# Patient Record
Sex: Female | Born: 1985 | Race: Black or African American | Hispanic: No | Marital: Single | Smoking: Never smoker
Health system: Southern US, Community
[De-identification: ages and names within clinical notes are randomized; demographics above are authoritative.]

## PROBLEM LIST (undated history)

## (undated) DIAGNOSIS — J45909 Unspecified asthma, uncomplicated: Secondary | ICD-10-CM

## (undated) DIAGNOSIS — G43909 Migraine, unspecified, not intractable, without status migrainosus: Secondary | ICD-10-CM

---

## 2014-09-29 ENCOUNTER — Emergency Department (HOSPITAL_COMMUNITY)
Admission: EM | Admit: 2014-09-29 | Discharge: 2014-09-29 | Disposition: A | Discharge status: Home / Self Care | Attending: Emergency Medicine | Admitting: Emergency Medicine

## 2014-09-29 DIAGNOSIS — M25511 Pain in right shoulder: Secondary | ICD-10-CM

## 2014-09-29 DIAGNOSIS — Y998 Other external cause status: Secondary | ICD-10-CM | POA: Diagnosis not present

## 2014-09-29 DIAGNOSIS — S4991XA Unspecified injury of right shoulder and upper arm, initial encounter: Secondary | ICD-10-CM | POA: Diagnosis not present

## 2014-09-29 DIAGNOSIS — W228XXA Striking against or struck by other objects, initial encounter: Secondary | ICD-10-CM | POA: Insufficient documentation

## 2014-09-29 DIAGNOSIS — Y9389 Activity, other specified: Secondary | ICD-10-CM | POA: Insufficient documentation

## 2014-09-29 DIAGNOSIS — Y9289 Other specified places as the place of occurrence of the external cause: Secondary | ICD-10-CM | POA: Insufficient documentation

## 2014-09-29 NOTE — ED Notes (Signed)
PT reports a injury at work this AM @ 0330 . Pt reports for eval of incressing pain to RT shoulder ,RT arm and RT hand. Pt reports a bruise to RT small finger.

## 2014-09-29 NOTE — ED Provider Notes (Signed)
CSN: [id number]     Arrival date & time 09/29/14  4098 History   First MD Initiated Contact with Patient 09/29/14 [phone]     Chief Complaint  Patient presents with  . Shoulder Pain  . Arm Injury  . Hand Pain   HPI   29 year old female presents today with complaints of right shoulder pain. Patient reports she was working today when he be some equipment from a vehicle hit her in the right anterior shoulder. Patient notes pain to the site immediately, she also notes that she was struck in the right third finger. Patient reports that she has full active range of motion, minimal pain with this. Patient denies any loss of distal sensation strength or function. No history of shoulder injuries. Patient is not tried any over-the-counter therapies at this point.  No past medical history on file. No past surgical history on file. No family history on file. Social History  Substance Use Topics  . Smoking status: Not on file  . Smokeless tobacco: Not on file  . Alcohol Use: Not on file   OB History    No data available     Review of Systems  All other systems reviewed and are negative.   Allergies  Review of patient's allergies indicates not on file.  Home Medications   Prior to Admission medications   Not on File   BP 111/65 mmHg  Pulse 96  Temp(Src) 97.5 F (36.4 C) (Oral)  Resp 18  SpO2 100%  LMP 08/29/2014   Physical Exam  Constitutional: She is oriented to person, place, and time. She appears well-developed and well-nourished.  HENT:  Head: Normocephalic and atraumatic.  Eyes: Conjunctivae are normal. Pupils are equal, round, and reactive to light. Right eye exhibits no discharge. Left eye exhibits no discharge. No scleral icterus.  Neck: Normal range of motion. No JVD present. No tracheal deviation present.  Pulmonary/Chest: Effort normal. No stridor.  Musculoskeletal:  Right shoulder: No obvious signs of trauma, symmetrical bilateral. Full active range of motion, minimal  pain with forward flexion and lateral flexion. Minimal tender with palpation of anterior shoulder. Sensation grossly intact, strength 5 out of 5, grip strength 5 out of 5, radial pulses 2+, Refill less than 3 seconds  Right hand a traumatic, full active range of motion, nontender to palpation  Neurological: She is alert and oriented to person, place, and time. Coordination normal.  Psychiatric: She has a normal mood and affect. Her behavior is normal. Judgment and thought content normal.  Nursing note and vitals reviewed.   ED Course  Procedures (including critical care time) Labs Review Labs Reviewed - No data to display  Imaging Review No results found. I have personally reviewed and evaluated these images and lab results as part of my medical decision-making.   EKG Interpretation None      MDM   Final diagnoses:  Right shoulder pain   Labs:   Imaging: None indicated   Consults:  Therapeutics:  Discharge Meds:   Assessment/Plan: Patient presents with right shoulder pain, atraumatic, full active range of motion.  Patient's child was in the room during the time my evaluation she repeatedly was able to lift the patient without any signs of pain or difficulty, no obvious strength or significant pain to the right shoulder. She will be discharged home with symptomatic care instructions, return precautions. Follow-up with primary care in 1 week for reevaluation. Patient verbalizes understanding and agreement to today's plan.  Eyvonne Mechanic, PA-C 09/29/14 1303  Derwood Kaplan, MD 09/29/14 [phone]

## 2014-09-29 NOTE — ED Notes (Signed)
Declined W/C at D/C and was escorted to lobby by RN. 

## 2014-09-29 NOTE — Discharge Instructions (Signed)
Arthralgia Arthralgia is joint pain. A joint is a place where two bones meet. Joint pain can happen for many reasons. The joint can be bruised, stiff, infected, or weak from aging. Pain usually goes away after resting and taking medicine for soreness.  HOME CARE  Rest the joint as told by your doctor.  Keep the sore joint raised (elevated) for the first 24 hours.  Put ice on the joint area.  Put ice in a plastic bag.  Place a towel between your skin and the bag.  Leave the ice on for 15-20 minutes, 03-04 times a day.  Wear your splint, casting, elastic bandage, or sling as told by your doctor.  Only take medicine as told by your doctor. Do not take aspirin.  Use crutches as told by your doctor. Do not put weight on the joint until told to by your doctor. GET HELP RIGHT AWAY IF:   You have bruising, puffiness (swelling), or more pain.  Your fingers or toes turn blue or start to lose feeling (numb).  Your medicine does not lessen the pain.  Your pain becomes severe.  You have a temperature by mouth above 102 F (38.9 C), not controlled by medicine.  You cannot move or use the joint. MAKE SURE YOU:   Understand these instructions.  Will watch your condition.  Will get help right away if you are not doing well or get worse. Document Released: 12/14/2008 Document Revised: 03/20/2011 Document Reviewed: 12/14/2008 [facility] Patient Information 2015 [place], [place]. This information is not intended to replace advice given to you by your health care provider. Make sure you discuss any questions you have with your health care provider.  Please follow-up with your primary care provider for reevaluation. Please reattach information

## 2015-11-23 ENCOUNTER — Encounter (HOSPITAL_BASED_OUTPATIENT_CLINIC_OR_DEPARTMENT_OTHER): Payer: Self-pay | Admitting: Emergency Medicine

## 2015-11-23 ENCOUNTER — Emergency Department (HOSPITAL_BASED_OUTPATIENT_CLINIC_OR_DEPARTMENT_OTHER)
Admission: EM | Admit: 2015-11-23 | Discharge: 2015-11-23 | Disposition: A | Discharge status: Home / Self Care | Payer: Worker's Compensation | Attending: Emergency Medicine | Admitting: Emergency Medicine

## 2015-11-23 DIAGNOSIS — M62838 Other muscle spasm: Secondary | ICD-10-CM | POA: Insufficient documentation

## 2015-11-23 DIAGNOSIS — M62830 Muscle spasm of back: Secondary | ICD-10-CM

## 2015-11-23 DIAGNOSIS — J45909 Unspecified asthma, uncomplicated: Secondary | ICD-10-CM | POA: Insufficient documentation

## 2015-11-23 DIAGNOSIS — M549 Dorsalgia, unspecified: Secondary | ICD-10-CM | POA: Diagnosis present

## 2015-11-23 HISTORY — DX: Migraine, unspecified, not intractable, without status migrainosus: G43.909

## 2015-11-23 HISTORY — DX: Unspecified asthma, uncomplicated: J45.909

## 2015-11-23 LAB — PREGNANCY, URINE: PREG TEST UR: NEGATIVE

## 2015-11-23 MED ORDER — CYCLOBENZAPRINE HCL 10 MG PO TABS: 10.0000 mg | ORAL_TABLET | Freq: Three times a day (TID) | ORAL | 0 refills | Status: AC | PRN

## 2015-11-23 NOTE — ED Provider Notes (Signed)
[facility] Provider Note: Ann DellJ. Lane Zaylee Cornia, MD, FACEP  CSN: [id number] MRN: [id number] ARRIVAL: 11/23/15 at 0336 ROOM: MH02/MH02   CHIEF COMPLAINT  Back Pain   HISTORY OF PRESENT ILLNESS  Ann James is a 30 y.o. female who works at the [employer]. She went to work Sunday evening (the day before yesterday) and worked 12 hours shift; she usually works 8 hour shifts. The shift involved repetitive lifting, carrying, pulling and pushing of heavy objects and pallets. She did not suffer a discrete injury. That is, she did not fall, strike her body suddenly against another object nor did she feel a pop or sudden pain. She left work feeling tired but not in pain. When she returned to work yesterday she found that as work progressed she developed increasing pain in her neck, more so on the left, her lumbar region and her legs. She describes the pain as feeling like cramps or labor pains. They are moderate to severe and worse with use of the affected muscles.    Past Medical History:  Diagnosis Date  . Asthma   . Migraines     History reviewed. No pertinent surgical history.  No family history on file.  Social History  Substance Use Topics  . Smoking status: Never Smoker  . Smokeless tobacco: Never Used  . Alcohol use Yes    Prior to Admission medications   Not on File    Allergies Depo-provera [medroxyprogesterone]   REVIEW OF SYSTEMS  Negative except as noted here or in the History of Present Illness.   PHYSICAL EXAMINATION  Initial Vital Signs Blood pressure 116/69, pulse 88, temperature 98.1 F (36.7 C), temperature source Oral, resp. rate 16, height 5\' 6"  (1.676 m), weight 97 lb (44 kg), SpO2 100 %.  Examination General: Well-developed, cachectic female in no acute distress; appearance consistent with age of record HENT: normocephalic; atraumatic Eyes: pupils equal, round and reactive to light; extraocular muscles intact Neck: supple; tender  posterior muscles with palpable spasm Heart: regular rate and rhythm Lungs: clear to auscultation bilaterally Abdomen: soft; nondistended Back: Tender paralumbar muscles with palpable spasm GU: Urine clear, light amber Extremities: No deformity; mildly tender muscles of thighs and calves Neurologic: Awake, alert and oriented; motor function intact in all extremities and symmetric; no facial droop Skin: Warm and dry Psychiatric: Flat affect   RESULTS  Summary of this visit's results, reviewed by myself:   EKG Interpretation  Date/Time:    Ventricular Rate:    PR Interval:    QRS Duration:   QT Interval:    QTC Calculation:   R Axis:     Text Interpretation:        Laboratory Studies: Results for orders placed or performed during the hospital encounter of 11/23/15 (from the past 24 hour(s))  Pregnancy, urine     Status: None   Collection Time: 11/23/15  4:04 AM  Result Value Ref Range   Preg Test, Ur NEGATIVE NEGATIVE   Imaging Studies: No results found.  ED COURSE  Nursing notes and initial vitals signs, including pulse oximetry, reviewed.  Vitals:   11/23/15 0342  BP: 116/69  Pulse: 88  Resp: 16  Temp: 98.1 F (36.7 C)  TempSrc: Oral  SpO2: 100%  Weight: 97 lb (44 kg)  Height: 5\' 6"  (1.676 m)   We'll advise rest from work the next 3 days and then return to duty as tolerated. She was advised to return should her urine become dark like iced  tea.  PROCEDURES    ED DIAGNOSES     ICD-9-CM ICD-10-CM   1. Lumbar paraspinal muscle spasm 724.8 M62.830   2. Muscle spasms of neck 728.85 M62.838   3. Muscle spasm of both lower legs 728.85 M62.838        Paula LibraJohn Deann Mclaine, MD 11/23/15 [phone]

## 2015-11-23 NOTE — ED Triage Notes (Signed)
Pt c/o neck, lower back and leg pain after working 12 hours on same machine at work sun night.

## 2016-03-18 ENCOUNTER — Emergency Department (HOSPITAL_BASED_OUTPATIENT_CLINIC_OR_DEPARTMENT_OTHER)

## 2016-03-18 ENCOUNTER — Encounter (HOSPITAL_BASED_OUTPATIENT_CLINIC_OR_DEPARTMENT_OTHER): Payer: Self-pay | Admitting: *Deleted

## 2016-03-18 ENCOUNTER — Emergency Department (HOSPITAL_BASED_OUTPATIENT_CLINIC_OR_DEPARTMENT_OTHER)
Admission: EM | Admit: 2016-03-18 | Discharge: 2016-03-18 | Disposition: A | Discharge status: Home / Self Care | Attending: Emergency Medicine | Admitting: Emergency Medicine

## 2016-03-18 DIAGNOSIS — M779 Enthesopathy, unspecified: Secondary | ICD-10-CM | POA: Insufficient documentation

## 2016-03-18 DIAGNOSIS — M25511 Pain in right shoulder: Secondary | ICD-10-CM | POA: Insufficient documentation

## 2016-03-18 DIAGNOSIS — J45909 Unspecified asthma, uncomplicated: Secondary | ICD-10-CM | POA: Diagnosis not present

## 2016-03-18 MED ORDER — IBUPROFEN 100 MG/5ML PO SUSP: 10.0000 mg/kg | Freq: Four times a day (QID) | ORAL | 1 refills | Status: AC | PRN

## 2016-03-18 NOTE — ED Notes (Signed)
Patient transported to X-ray 

## 2016-03-18 NOTE — ED Notes (Signed)
Pt given d/c instructions as per chart. Rx x 1. Verbalizes understanding. No questions. 

## 2016-03-18 NOTE — ED Notes (Signed)
Pt states she works for the [employer] and injured her right shoulder on Thursday. Equal grips bilat. Cap refill < 3 sec. Radial pulse palp.

## 2016-03-18 NOTE — ED Triage Notes (Signed)
Pt says that she has had right shoulder pain for about starting Thursday. Denies specific injury, but reports that she does lift heavy boxes at work. No meds PTA

## 2016-03-18 NOTE — ED Provider Notes (Signed)
[facility] Provider Note   CSN: [id number] Arrival date & time: 03/18/16  2014   By signing my name below, I, Clarisse Gouge, attest that this documentation has been prepared under the direction and in the presence of Rolan Bucco, MD. Electronically signed, Clarisse Gouge, ED Scribe. 03/18/16. 9:41 PM.   History   Chief Complaint Chief Complaint  Patient presents with  . Shoulder Pain   The history is provided by the patient and medical records. No language interpreter was used.    HPI Comments: Ann James is a 31 y.o. female who presents to the Emergency Department complaining of right shoulder pain x 3 days. She notes radiation down the arm to the lateral bicep. She states she works in [employer] and her job requires her to lift and throw heavy boxes.  No treatments attempted at home. Pt denies numbness, weakness or decreased ROM.  Past Medical History:  Diagnosis Date  . Asthma   . Migraines     There are no active problems to display for this patient.   History reviewed. No pertinent surgical history.  OB History    No data available       Home Medications    Prior to Admission medications   Medication Sig Start Date End Date Taking? Authorizing Provider  cyclobenzaprine (FLEXERIL) 10 MG tablet Take 1 tablet (10 mg total) by mouth 3 (three) times daily as needed for muscle spasms. 11/23/15   John Molpus, MD  ibuprofen (CHILD IBUPROFEN) 100 MG/5ML suspension Take 21.6 mLs (432 mg total) by mouth every 6 (six) hours as needed. 03/18/16   Rolan Bucco, MD    Family History No family history on file.  Social History Social History  Substance Use Topics  . Smoking status: Never Smoker  . Smokeless tobacco: Never Used  . Alcohol use Yes     Allergies   Depo-provera [medroxyprogesterone]   Review of Systems Review of Systems  Constitutional: Negative for fever.  Gastrointestinal: Negative for nausea and vomiting.  Musculoskeletal:  Positive for arthralgias. Negative for back pain, joint swelling and neck pain.  Skin: Negative for wound.  Neurological: Negative for weakness, numbness and headaches.     Physical Exam Updated Vital Signs BP 114/85 (BP Location: Left Arm)   Pulse 60   Temp 97.7 F (36.5 C) (Oral)   Resp 17   Ht 5\' 6"  (1.676 m)   Wt 95 lb (43.1 kg)   LMP 03/18/2016   SpO2 100%   BMI 15.33 kg/m   Physical Exam  Constitutional: She is oriented to person, place, and time. She appears well-developed and well-nourished.  HENT:  Head: Normocephalic and atraumatic.  Neck: Normal range of motion. Neck supple.  Cardiovascular: Normal rate.   Pulmonary/Chest: Effort normal.  Musculoskeletal: She exhibits tenderness. She exhibits no edema.  Patient has mild tenderness on the anterior and lateral shoulder however primarily her tenderness is at the insertion site of the biceps tendon and she has some mild tenderness to the biceps tendon insertion at the elbow. There is no swelling or effusion noted. No warmth or erythema. No wounds. She has no pain with rotation of the shoulder joint. There some mild pain with abduction. She has normal sensation and motor function in the hand. Radial pulses are intact.  Neurological: She is alert and oriented to person, place, and time.  Skin: Skin is warm and dry.  Psychiatric: She has a normal mood and affect.     ED Treatments /  Results  DIAGNOSTIC STUDIES: Oxygen Saturation is 100% on RA, normal by my interpretation.    COORDINATION OF CARE: 9:40 PM Discussed treatment plan with pt at bedside and pt agreed to plan. Pt advised of symptomatic care at home. Pt further prepared for discharge. Will refer to sport medicine specialist.  Labs (all labs ordered are listed, but only abnormal results are displayed) Labs Reviewed - No data to display  EKG  EKG Interpretation None       Radiology Dg Shoulder Right  Result Date: 03/18/2016 CLINICAL DATA:  Right  shoulder pain for several days following repetitive lifting. Initial encounter. EXAM: RIGHT SHOULDER - 2+ VIEW COMPARISON:  None. FINDINGS: There is no evidence of fracture or dislocation. There is no evidence of arthropathy or other focal bone abnormality. Soft tissues are unremarkable. IMPRESSION: Negative. Electronically Signed   By: Harmon PierJeffrey  Hu M.D.   On: 03/18/2016 20:13    Procedures Procedures (including critical care time)  Medications Ordered in ED Medications - No data to display   Initial Impression / Assessment and Plan / ED Course  I have reviewed the triage vital signs and the nursing notes.  Pertinent labs & imaging results that were available during my care of the patient were reviewed by me and considered in my medical decision making (see chart for details).   patient presents with shoulder pain. No bony injuries noted. She likely has tendinitis. She was advised to use anti-inflammatory medications. She states that she cannot take any pills. I gave her prescription for ibuprofen suspension. She was given a referral to follow-up with Dr. Pearletha ForgeHudnall if her symptoms are not improving.  I personally performed the services described in this documentation, which was scribed in my presence.  The recorded information has been reviewed and considered.   Final Clinical Impressions(s) / ED Diagnoses   Final diagnoses:  Acute pain of right shoulder  Tendonitis    New Prescriptions New Prescriptions   IBUPROFEN (CHILD IBUPROFEN) 100 MG/5ML SUSPENSION    Take 21.6 mLs (432 mg total) by mouth every 6 (six) hours as needed.     Rolan BuccoMelanie Tish Begin, MD 03/18/16 2158

## 2016-04-02 ENCOUNTER — Encounter (HOSPITAL_BASED_OUTPATIENT_CLINIC_OR_DEPARTMENT_OTHER): Payer: Self-pay | Admitting: Emergency Medicine

## 2016-04-02 ENCOUNTER — Emergency Department (HOSPITAL_BASED_OUTPATIENT_CLINIC_OR_DEPARTMENT_OTHER)
Admission: EM | Admit: 2016-04-02 | Discharge: 2016-04-02 | Disposition: A | Discharge status: Home / Self Care | Payer: Worker's Compensation | Attending: Emergency Medicine | Admitting: Emergency Medicine

## 2016-04-02 DIAGNOSIS — Y990 Civilian activity done for income or pay: Secondary | ICD-10-CM | POA: Diagnosis not present

## 2016-04-02 DIAGNOSIS — J45909 Unspecified asthma, uncomplicated: Secondary | ICD-10-CM | POA: Insufficient documentation

## 2016-04-02 DIAGNOSIS — Y929 Unspecified place or not applicable: Secondary | ICD-10-CM | POA: Insufficient documentation

## 2016-04-02 DIAGNOSIS — S0990XA Unspecified injury of head, initial encounter: Secondary | ICD-10-CM | POA: Diagnosis not present

## 2016-04-02 DIAGNOSIS — Z79899 Other long term (current) drug therapy: Secondary | ICD-10-CM | POA: Diagnosis not present

## 2016-04-02 DIAGNOSIS — Y9389 Activity, other specified: Secondary | ICD-10-CM | POA: Diagnosis not present

## 2016-04-02 DIAGNOSIS — W208XXA Other cause of strike by thrown, projected or falling object, initial encounter: Secondary | ICD-10-CM | POA: Insufficient documentation

## 2016-04-02 MED ORDER — IBUPROFEN 100 MG/5ML PO SUSP
400.0000 mg | Freq: Once | ORAL | Status: AC
  Administered 2016-04-02: 400 mg via ORAL
  Filled 2016-04-02: qty 20

## 2016-04-02 NOTE — Discharge Instructions (Signed)
You may have had a concussion. Please avoid any mental or physical activity until your symptoms resolved. Motrin and Tylenol for pain. May apply ice to the top of her head were your hit. Please follow-up with the primary care doctor to have a recheck. Return to the ED sooner if he develops any worsening symptoms including vomiting, dizziness, vision changes, worsening headache or for any reason.

## 2016-04-02 NOTE — ED Notes (Signed)
EDPA into room, prior to RN assessment, see PA notes, orders received to medicate and d/c. Care assumed at time of d/c.

## 2016-04-02 NOTE — ED Triage Notes (Addendum)
Patient reports she was at work and the top of some metal equipment fell on her head tonight.  Denies LOC.  Patient is worker's comp and states they wanted her to come and get checked.  Reports dizziness when event occurred but this has since subsided.  Patient alert and oriented at present.  Ambulatory to triage room in NAD.

## 2016-04-02 NOTE — ED Provider Notes (Signed)
[facility]   CSN: [id number] Arrival date & time: 04/02/16  2110  By signing my name below, I, Nelwyn Salisbury, attest that this documentation has been prepared under the direction and in the presence of non-physician practitioner, Rise Mu, PA-C. Electronically Signed: Nelwyn Salisbury, Scribe. 04/02/2016. 9:44 PM.  History   Chief Complaint Chief Complaint  Patient presents with  . Head Injury   The history is provided by the patient. No language interpreter was used.    HPI Comments:  Ann James is a 31 y.o. female who presents to the Emergency Department complaining of constant, gradually resolving, 7/10 headache after hitting her head on some equipment at work earlier today. Pt denies any bleeding. She reports that at the time of the accident she became dizzy and had to lean on a nearby solid surface until the dizziness resolved. No modifying factors indicated. Pt has not tried anything for her symptoms PTA. She denies any nausea, visual disturbance, loc, emesis, neck pain, back pain, or photophobia.   Past Medical History:  Diagnosis Date  . Asthma   . Migraines     There are no active problems to display for this patient.   History reviewed. No pertinent surgical history.  OB History    No data available       Home Medications    Prior to Admission medications   Medication Sig Start Date End Date Taking? Authorizing Provider  cyclobenzaprine (FLEXERIL) 10 MG tablet Take 1 tablet (10 mg total) by mouth 3 (three) times daily as needed for muscle spasms. 11/23/15   John Molpus, MD  ibuprofen (CHILD IBUPROFEN) 100 MG/5ML suspension Take 21.6 mLs (432 mg total) by mouth every 6 (six) hours as needed. 03/18/16   Rolan Bucco, MD    Family History History reviewed. No pertinent family history.  Social History Social History  Substance Use Topics  . Smoking status: Never Smoker  . Smokeless tobacco: Never Used  . Alcohol use  Yes     Allergies   Depo-provera [medroxyprogesterone]   Review of Systems Review of Systems  Eyes: Negative for photophobia and visual disturbance.  Gastrointestinal: Negative for nausea.  Musculoskeletal: Negative for back pain, gait problem and neck pain.  Skin: Negative for wound.  Neurological: Positive for headaches. Negative for dizziness, syncope, weakness and light-headedness.     Physical Exam Updated Vital Signs BP 124/87 (BP Location: Left Arm)   Pulse (!) 58   Temp 97.4 F (36.3 C) (Oral)   Resp 18   Ht 5\' 6"  (1.676 m)   Wt 95 lb (43.1 kg)   LMP 03/18/2016   SpO2 100%   BMI 15.33 kg/m   Physical Exam  Constitutional: She is oriented to person, place, and time. She appears well-developed and well-nourished. No distress.  HENT:  Head: Normocephalic and atraumatic.  Right Ear: No hemotympanum.  Left Ear: No hemotympanum.  Nose: No nasal septal hematoma.  Mouth/Throat: Uvula is midline and oropharynx is clear and moist.  Eyes: Conjunctivae and EOM are normal. Pupils are equal, round, and reactive to light.  Neck: Normal range of motion. Neck supple.  No midline tenderness. No step off or deformity noted.  Cardiovascular: Normal rate.   Pulmonary/Chest: Effort normal.  Abdominal: She exhibits no distension.  Neurological: She is alert and oriented to person, place, and time.  The patient is alert, attentive, and oriented x 3. Speech is clear. Cranial nerve II-VII grossly intact. Negative pronator drift. Sensation intact. Strength  5/5 in all extremities. Reflexes 2+ and symmetric at biceps, triceps, knees, and ankles. Rapid alternating movement and fine finger movements intact. Romberg is absent. Posture and gait normal.   Skin: Skin is warm and dry.  Psychiatric: She has a normal mood and affect.  Nursing note and vitals reviewed.    ED Treatments / Results  DIAGNOSTIC STUDIES:  Oxygen Saturation is 100% on RA, normal by my interpretation.     COORDINATION OF CARE:  9:46 PM Discussed treatment plan with pt at bedside which includes OTC pain management and pt agreed to plan.  Labs (all labs ordered are listed, but only abnormal results are displayed) Labs Reviewed - No data to display  EKG  EKG Interpretation None       Radiology No results found.  Procedures Procedures (including critical care time)  Medications Ordered in ED Medications  ibuprofen (ADVIL,MOTRIN) 100 MG/5ML suspension 400 mg (400 mg Oral Given 04/02/16 2213)     Initial Impression / Assessment and Plan / ED Course  I have reviewed the triage vital signs and the nursing notes.  Pertinent labs & imaging results that were available during my care of the patient were reviewed by me and considered in my medical decision making (see chart for details).     Pt presents to the ED after hitting her head while at work. Denies LOC. Denies neck or back pain. No vomiting. No focal neurological deficits on physical exam.  Pt observed in the ED.  [place] CT rule 0. Dicussed with pt.  CT is not indicated at this time. Discussed symptoms of post concussive syndrome and reasons to return to the emergency department including any new  severe headaches, disequilibrium, vomiting, double vision, extremity weakness, difficulty ambulating, or any other concerning symptoms. Patient will be discharged with information pertaining to diagnosis. Motrin and tylenol at home for ha. Encouraged follow up with pcp.  Pt is safe for discharge at this time. VS stable. Normal gait. All questions answered prior to d/c.   Final Clinical Impressions(s) / ED Diagnoses   Final diagnoses:  Minor head injury, initial encounter    New Prescriptions Discharge Medication List as of 04/02/2016  9:53 PM    I personally performed the services described in this documentation, which was scribed in my presence. The recorded information has been reviewed and is accurate.     Rise MuKenneth T  Cass Edinger, PA-C 04/04/16 1453    Geoffery Lyonsouglas Delo, MD 04/05/16 [phone]

## 2018-01-18 IMAGING — CR DG SHOULDER 2+V*R*
3 series · 3 of 3 positions shown · non-contrast
Comparison: None.

CLINICAL DATA: Right shoulder pain for several days following
repetitive lifting. Initial encounter.

EXAM:
RIGHT SHOULDER - 2+ VIEW

[w shoulder grashey right]
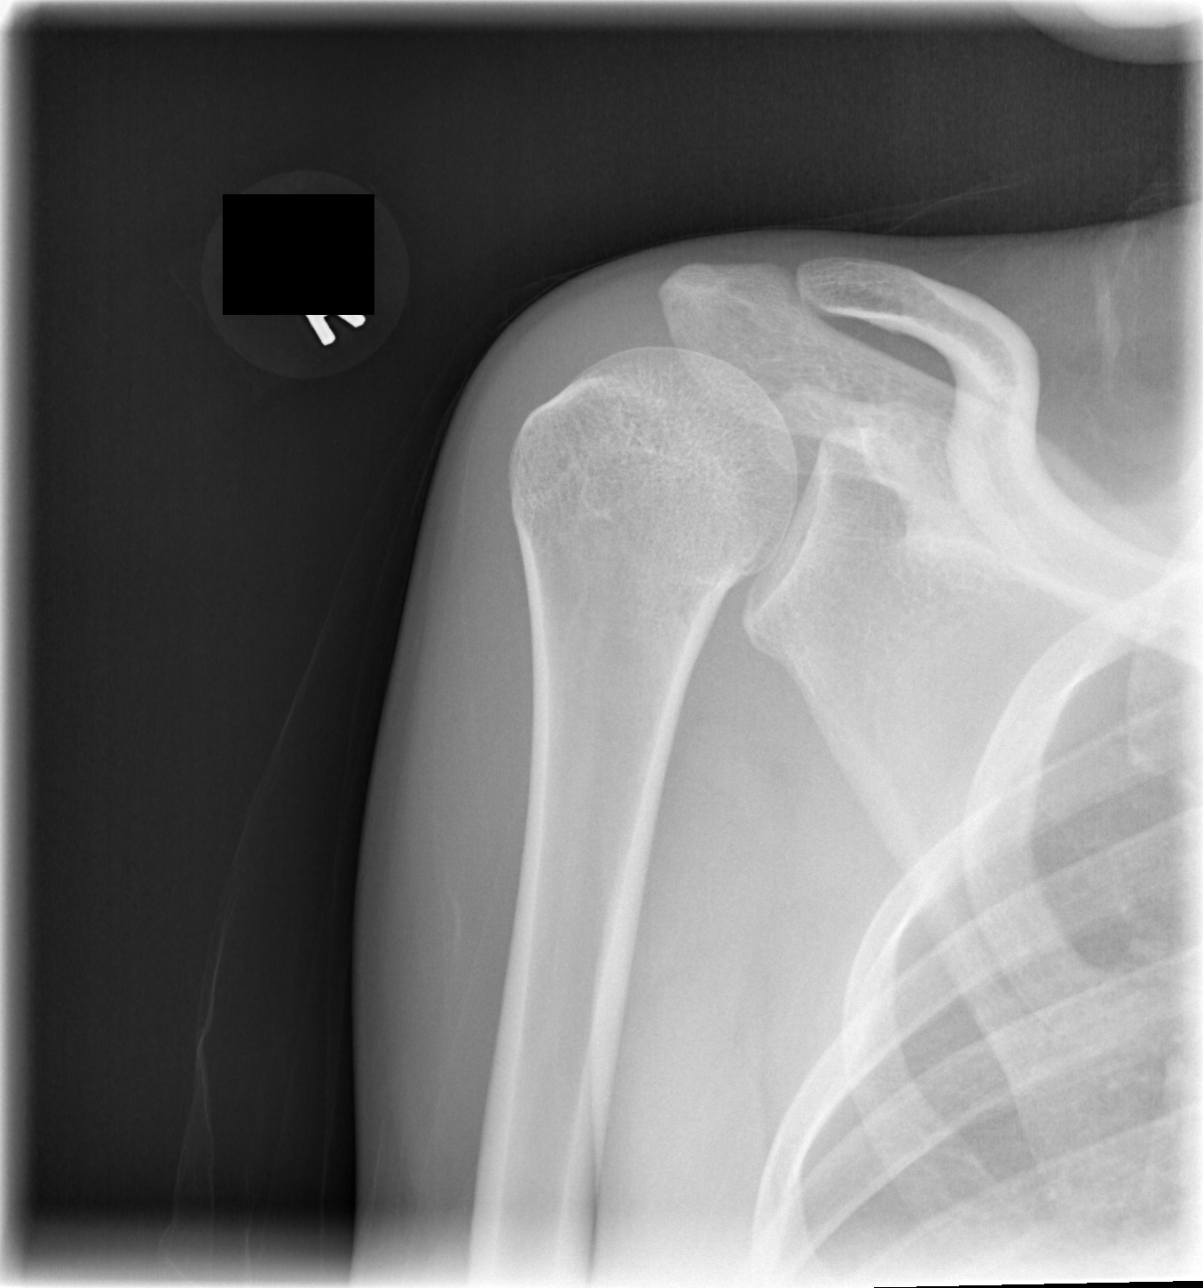

[w shoulder y view right]
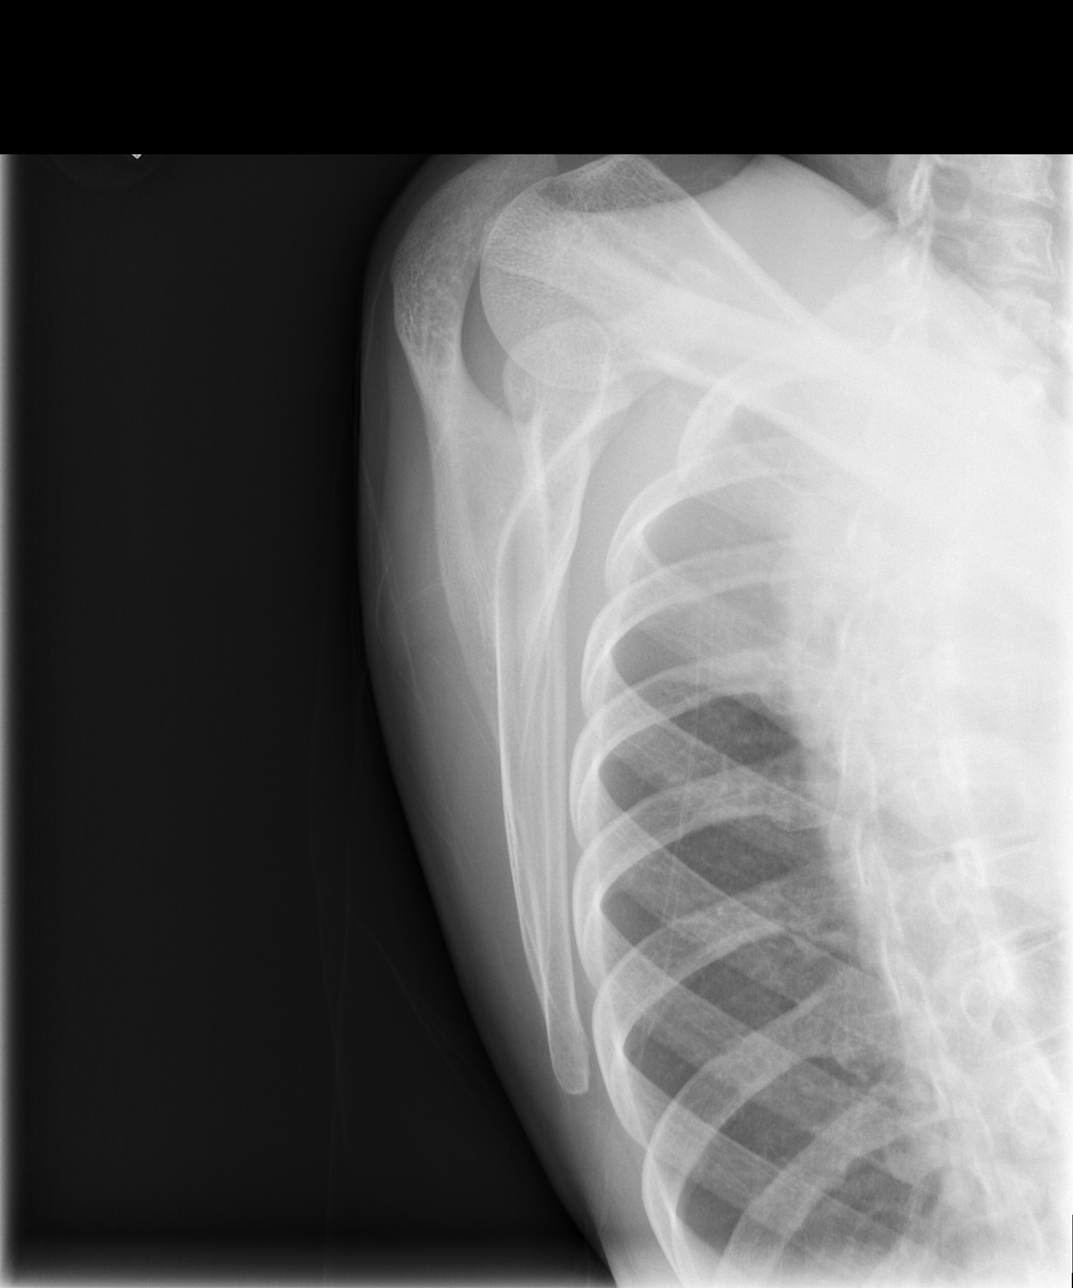

[x shoulder axillary right]
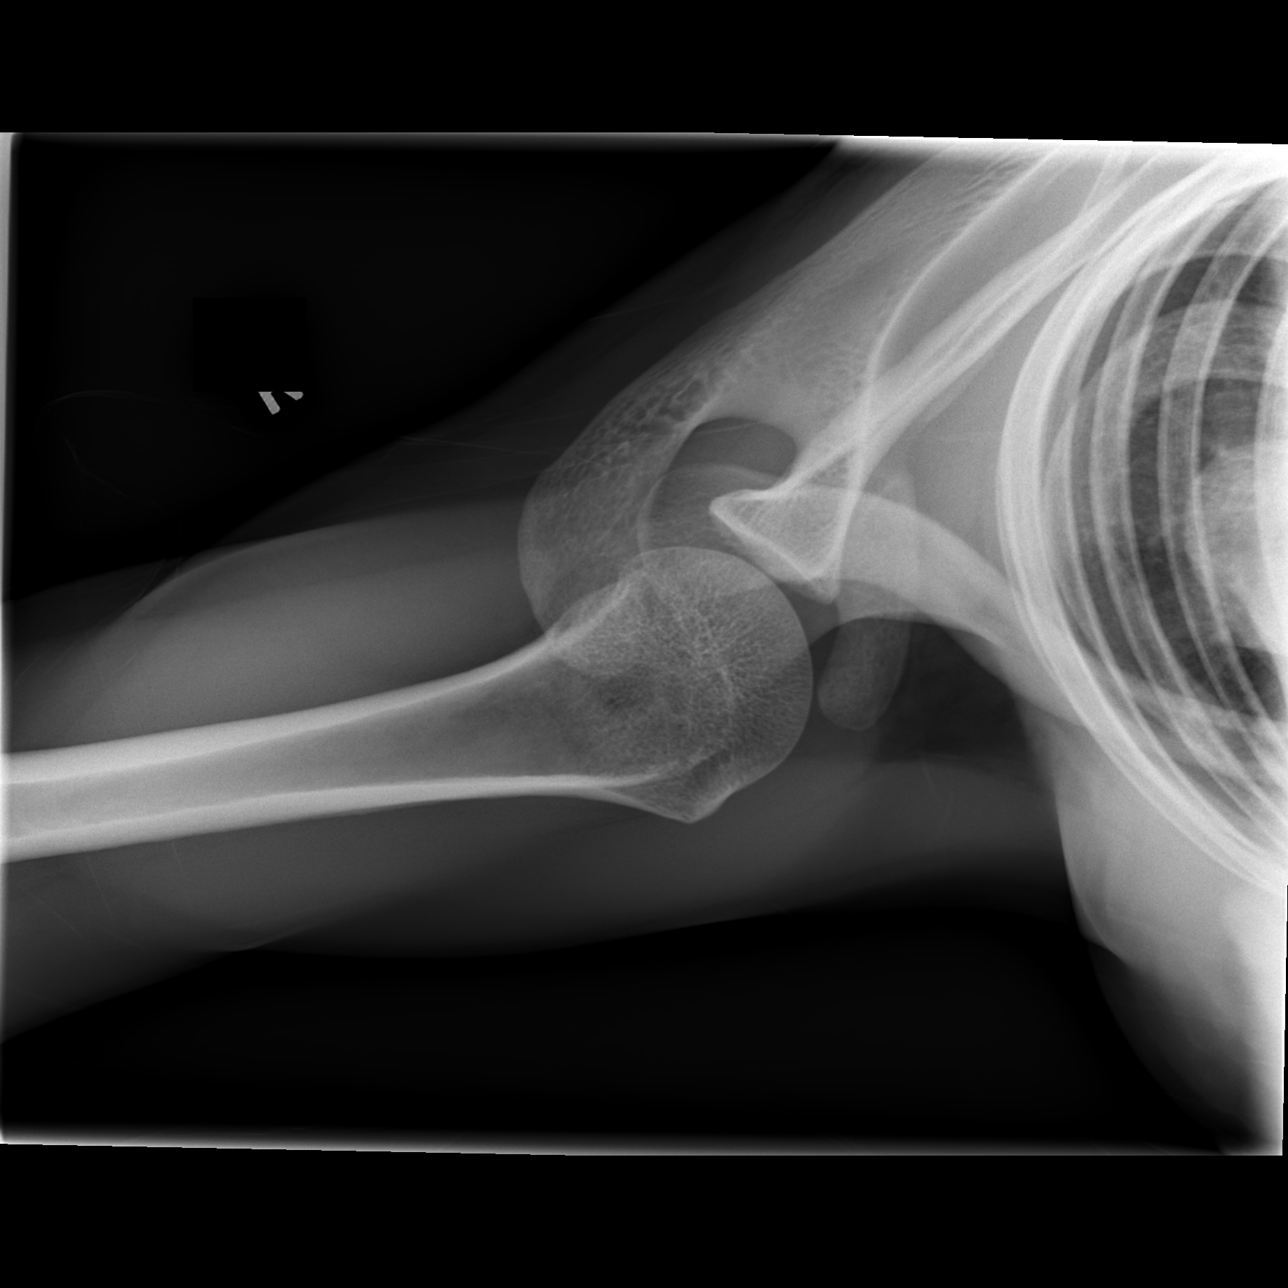

[3 of 3 positions shown; findings below may reference images not displayed]

FINDINGS: There is no evidence of fracture or dislocation. There is no
evidence of arthropathy or other focal bone abnormality. Soft
tissues are unremarkable.
IMPRESSION: Negative.
